# Patient Record
Sex: Male | Born: 1995 | Race: White | Hispanic: No | Marital: Single | Smoking: Never smoker
Health system: Southern US, Community
[De-identification: ages and names within clinical notes are randomized; demographics above are authoritative.]

## PROBLEM LIST (undated history)

## (undated) DIAGNOSIS — F329 Major depressive disorder, single episode, unspecified: Secondary | ICD-10-CM

## (undated) DIAGNOSIS — G43909 Migraine, unspecified, not intractable, without status migrainosus: Secondary | ICD-10-CM

## (undated) DIAGNOSIS — F32A Depression, unspecified: Secondary | ICD-10-CM

## (undated) DIAGNOSIS — T7840XA Allergy, unspecified, initial encounter: Secondary | ICD-10-CM

## (undated) HISTORY — DX: Major depressive disorder, single episode, unspecified: F32.9

## (undated) HISTORY — PX: SIGMOIDOSCOPY: SUR1295

## (undated) HISTORY — DX: Depression, unspecified: F32.A

## (undated) HISTORY — PX: TONSILLECTOMY: SUR1361

## (undated) HISTORY — DX: Allergy, unspecified, initial encounter: T78.40XA

---
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
## 2013-03-09 ENCOUNTER — Ambulatory Visit: Payer: Self-pay | Admitting: Emergency Medicine

## 2014-02-05 ENCOUNTER — Ambulatory Visit: Payer: Self-pay | Admitting: Family Medicine

## 2014-02-16 ENCOUNTER — Ambulatory Visit (INDEPENDENT_AMBULATORY_CARE_PROVIDER_SITE_OTHER): Payer: BC Managed Care – PPO

## 2014-02-16 ENCOUNTER — Ambulatory Visit (INDEPENDENT_AMBULATORY_CARE_PROVIDER_SITE_OTHER): Payer: BC Managed Care – PPO | Admitting: Podiatry

## 2014-02-16 ENCOUNTER — Encounter: Payer: Self-pay | Admitting: Podiatry

## 2014-02-16 VITALS — BP 133/98 | HR 80 | Resp 16 | Ht 73.0 in | Wt 290.0 lb

## 2014-02-16 DIAGNOSIS — Q6650 Congenital pes planus, unspecified foot: Secondary | ICD-10-CM

## 2014-02-16 DIAGNOSIS — M7750 Other enthesopathy of unspecified foot: Secondary | ICD-10-CM

## 2014-02-16 DIAGNOSIS — L03119 Cellulitis of unspecified part of limb: Secondary | ICD-10-CM

## 2014-02-16 DIAGNOSIS — L02619 Cutaneous abscess of unspecified foot: Secondary | ICD-10-CM

## 2014-02-16 NOTE — Progress Notes (Signed)
Subjective:     Patient ID: Victor Schwartz, male   DOB: [birth year], 18 y.o.   MRN: [id number]  HPI patient presents stating I have such flatfeet I been getting blisters and I developed cellulitis which was treated with oral antibiotics and is better. I need something to help lift my arches   Review of Systems  All other systems reviewed and are negative.      Objective:   Physical Exam  Nursing note and vitals reviewed. Cardiovascular: Intact distal pulses.   Musculoskeletal: Normal range of motion.  Neurological: He is alert.  Skin: Skin is warm.   neurovascular status intact with range of motion adequate and mild equinus condition noted of both feet. Muscle strength is within normal limits and I noted there is some abrasion in the medial arch area both feet and also small blisters on the back of the heels both feet with activity. 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 Recommended customized orthotics with scans done today to lift the medial arch and help to prevent pressure that's occurring. Apply Silvadene to the posterior heel where there small blisters and advised on soaks and padding and reappoint when orthotics are ready

## 2014-02-16 NOTE — Progress Notes (Signed)
   Subjective:    Patient ID: Victor Schwartz, male    DOB: 1995-09-18, 18 y.o.   MRN: [id number]  HPI Comments: Was referred here after having cellulitis in both of my feet. i have blisters on the medial side of both feet . Its been about 22 days with them.  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      Objective:   Physical Exam        Assessment & Plan:

## 2014-02-26 ENCOUNTER — Encounter: Payer: Self-pay | Admitting: *Deleted

## 2014-02-26 NOTE — Progress Notes (Signed)
Called and left message for pt to call office to schedule appt to pick up orthotics. Also, sent post card letting him know orthotics are in office.

## 2014-02-26 NOTE — Progress Notes (Signed)
Sent pt post card letting him know orthotics are here. 

## 2014-02-28 ENCOUNTER — Ambulatory Visit: Payer: Self-pay | Admitting: Internal Medicine

## 2014-02-28 LAB — COMPREHENSIVE METABOLIC PANEL
ALT: 76 U/L (ref 12–78)
Albumin: 4.1 g/dL (ref 3.4–5.0)
Alkaline Phosphatase: 68 U/L
Anion Gap: 7 (ref 7–16)
BILIRUBIN TOTAL: 0.5 mg/dL (ref 0.2–1.0)
BUN: 10 mg/dL (ref 7–18)
CREATININE: 0.74 mg/dL (ref 0.60–1.30)
Calcium, Total: 9 mg/dL (ref 8.5–10.1)
Chloride: 104 mmol/L (ref 98–107)
Co2: 28 mmol/L (ref 21–32)
EGFR (African American): >60
Glucose: 90 mg/dL (ref 65–99)
Osmolality: 276 (ref 275–301)
POTASSIUM: 4.3 mmol/L (ref 3.5–5.1)
SGOT(AST): 39 U/L — ABNORMAL HIGH (ref 15–37)
SODIUM: 139 mmol/L (ref 136–145)
TOTAL PROTEIN: 7.2 g/dL (ref 6.4–8.2)

## 2014-02-28 LAB — CBC WITH DIFFERENTIAL/PLATELET
Basophil #: 0 10*3/uL (ref 0.0–0.1)
Basophil %: 0.6 %
EOS PCT: 3.5 %
Eosinophil #: 0.2 10*3/uL (ref 0.0–0.7)
HCT: 44.3 % (ref 40.0–52.0)
HGB: 15.1 g/dL (ref 13.0–18.0)
Lymphocyte #: 2 10*3/uL (ref 1.0–3.6)
Lymphocyte %: 31.7 %
MCH: 30.4 pg (ref 26.0–34.0)
MCHC: 34.2 g/dL (ref 32.0–36.0)
MCV: 89 fL (ref 80–100)
Monocyte #: 0.5 x10 3/mm (ref 0.2–1.0)
Monocyte %: 8 %
Neutrophil #: 3.6 10*3/uL (ref 1.4–6.5)
Neutrophil %: 56.2 %
PLATELETS: 249 10*3/uL (ref 150–440)
RBC: 4.97 10*6/uL (ref 4.40–5.90)
RDW: 12.7 % (ref 11.5–14.5)
WBC: 6.3 10*3/uL (ref 3.8–10.6)

## 2014-02-28 LAB — URINALYSIS, COMPLETE
Bilirubin,UR: NEGATIVE
Glucose,UR: NEGATIVE mg/dL (ref 0–75)
Ketone: NEGATIVE
Leukocyte Esterase: NEGATIVE
NITRITE: NEGATIVE
Ph: 6.5 (ref 4.5–8.0)
SPECIFIC GRAVITY: 1.02 (ref 1.003–1.030)
Squamous Epithelial: NONE SEEN

## 2014-03-05 ENCOUNTER — Ambulatory Visit (INDEPENDENT_AMBULATORY_CARE_PROVIDER_SITE_OTHER): Payer: BC Managed Care – PPO | Admitting: *Deleted

## 2014-03-05 DIAGNOSIS — M7750 Other enthesopathy of unspecified foot: Secondary | ICD-10-CM

## 2014-03-05 DIAGNOSIS — Q6650 Congenital pes planus, unspecified foot: Secondary | ICD-10-CM

## 2014-03-05 NOTE — Progress Notes (Signed)
Pt  Presents for orthotic pick up written and verbal instructions given

## 2014-03-05 NOTE — Patient Instructions (Signed)

## 2014-04-02 ENCOUNTER — Other Ambulatory Visit: Payer: BC Managed Care – PPO

## 2016-01-17 ENCOUNTER — Ambulatory Visit
Admission: EM | Admit: 2016-01-17 | Discharge: 2016-01-17 | Disposition: A | Discharge status: Home / Self Care | Payer: Managed Care, Other (non HMO) | Attending: Family Medicine | Admitting: Family Medicine

## 2016-01-17 DIAGNOSIS — J029 Acute pharyngitis, unspecified: Secondary | ICD-10-CM | POA: Diagnosis not present

## 2016-01-17 DIAGNOSIS — J0100 Acute maxillary sinusitis, unspecified: Secondary | ICD-10-CM

## 2016-01-17 HISTORY — DX: Migraine, unspecified, not intractable, without status migrainosus: G43.909

## 2016-01-17 LAB — CBC WITH DIFFERENTIAL/PLATELET
Basophils Absolute: 0.1 10*3/uL (ref 0–0.1)
Basophils Relative: 1 %
Eosinophils Absolute: 0.2 10*3/uL (ref 0–0.7)
HCT: 44.9 % (ref 40.0–52.0)
HEMOGLOBIN: 15.4 g/dL (ref 13.0–18.0)
Lymphs Abs: 4.1 10*3/uL — ABNORMAL HIGH (ref 1.0–3.6)
MCH: 29.5 pg (ref 26.0–34.0)
MCHC: 34.4 g/dL (ref 32.0–36.0)
MCV: 85.8 fL (ref 80.0–100.0)
Monocytes Absolute: 1.3 10*3/uL — ABNORMAL HIGH (ref 0.2–1.0)
Monocytes Relative: 9 %
Neutro Abs: 8.9 10*3/uL — ABNORMAL HIGH (ref 1.4–6.5)
PLATELETS: 348 10*3/uL (ref 150–440)
RBC: 5.23 MIL/uL (ref 4.40–5.90)
RDW: 12.8 % (ref 11.5–14.5)
WBC: 14.6 10*3/uL — AB (ref 3.8–10.6)

## 2016-01-17 LAB — RAPID STREP SCREEN (MED CTR MEBANE ONLY): STREPTOCOCCUS, GROUP A SCREEN (DIRECT): NEGATIVE

## 2016-01-17 LAB — MONONUCLEOSIS SCREEN: MONO SCREEN: NEGATIVE

## 2016-01-17 MED ORDER — PREDNISONE 10 MG (21) PO TBPK: ORAL_TABLET | ORAL | Status: AC

## 2016-01-17 MED ORDER — FEXOFENADINE-PSEUDOEPHED ER 180-240 MG PO TB24: 1.0000 | ORAL_TABLET | Freq: Every day | ORAL | Status: AC

## 2016-01-17 MED ORDER — LEVOFLOXACIN 500 MG PO TABS: 500.0000 mg | ORAL_TABLET | Freq: Every day | ORAL | Status: AC

## 2016-01-17 MED ORDER — FLUTICASONE PROPIONATE 50 MCG/ACT NA SUSP: 2.0000 | Freq: Every day | NASAL | Status: AC

## 2016-01-17 NOTE — Discharge Instructions (Signed)
Pharyngitis °Pharyngitis is a sore throat (pharynx). There is redness, pain, and swelling of your throat. °HOME CARE  °· Drink enough fluids to keep your pee (urine) clear or pale yellow. °· Only take medicine as told by your doctor. °· You may get sick again if you do not take medicine as told. Finish your medicines, even if you start to feel better. °· Do not take aspirin. °· Rest. °· Rinse your mouth (gargle) with salt water (½ tsp of salt per 1 qt of water) every 1-2 hours. This will help the pain. °· If you are not at risk for choking, you can suck on hard candy or sore throat lozenges. °GET HELP IF: °· You have large, tender lumps on your neck. °· You have a rash. °· You cough up green, yellow-brown, or bloody spit. °GET HELP RIGHT AWAY IF:  °· You have a stiff neck. °· You drool or cannot swallow liquids. °· You throw up (vomit) or are not able to keep medicine or liquids down. °· You have very bad pain that does not go away with medicine. °· You have problems breathing (not from a stuffy nose). °MAKE SURE YOU:  °· Understand these instructions. °· Will watch your condition. °· Will get help right away if you are not doing well or get worse. °  °This information is not intended to replace advice given to you by your health care provider. Make sure you discuss any questions you have with your health care provider. °  °Document Released: 02/13/2008 Document Revised: 06/17/2013 Document Reviewed: 05/04/2013 °Elsevier Interactive Patient Education ©2016 [employer] ° °Sinusitis, Adult °Sinusitis is redness, soreness, and puffiness (inflammation) of the air pockets in the bones of your face (sinuses). The redness, soreness, and puffiness can cause air and mucus to get trapped in your sinuses. This can allow germs to grow and cause an infection.  °HOME CARE  °· Drink enough fluids to keep your pee (urine) clear or pale yellow. °· Use a humidifier in your home. °· Run a hot shower to create steam in the bathroom.  Sit in the bathroom with the door closed. Breathe in the steam 3-4 times a day. °· Put a warm, moist washcloth on your face 3-4 times a day, or as told by your doctor. °· Use salt water sprays (saline sprays) to wet the thick fluid in your nose. This can help the sinuses drain. °· Only take medicine as told by your doctor. °GET HELP RIGHT AWAY IF:  °· Your pain gets worse. °· You have very bad headaches. °· You are sick to your stomach (nauseous). °· You throw up (vomit). °· You are very sleepy (drowsy) all the time. °· Your face is puffy (swollen). °· Your vision changes. °· You have a stiff neck. °· You have trouble breathing. °MAKE SURE YOU:  °· Understand these instructions. °· Will watch your condition. °· Will get help right away if you are not doing well or get worse. °  °This information is not intended to replace advice given to you by your health care provider. Make sure you discuss any questions you have with your health care provider. °  °Document Released: 02/13/2008 Document Revised: 09/17/2014 Document Reviewed: 04/01/2012 °Elsevier Interactive Patient Education ©2016 [employer] ° °

## 2016-01-17 NOTE — ED Provider Notes (Signed)
CSN: [id number]     Arrival date & time 01/17/16  1024 History   First MD Initiated Contact with Patient 01/17/16 1110    Nurses notes were reviewed. Chief Complaint  Patient presents with  . Sore Throat    Several weeks of sore throat, coughing up green phlegm and green nasal drainage. Treated for both strep throat and sinus infection with ABX but reports not feeling better. Pain 4/10   Patient reports April 17 going see his doctor for positive strep throat. At about a week of being on amoxicillin didn't like using much better he was seen and he was placed on cefuroxime 500 mg twice a day for 10 days and prednisone and Cheratussin. He states that about 3 or 4 days he actually did feel better but he started feeling worse and he still has 3 days left of his cefuroxime came in to be seen and evaluated. There is some fatigue. He never smoked he's had tonsillectomy and sigmoidoscopy in the past history of allergies depression and migraines. The only pertinent family medical history is assisted he has not seen in about 3 months has come down with mono.     (Consider location/radiation/quality/duration/timing/severity/associated sxs/prior Treatment) Patient is a 20 y.o. male presenting with pharyngitis. The history is provided by the patient. No language interpreter was used.  Sore Throat This is a new problem. The current episode started more than 1 week ago. The problem occurs constantly. The problem has been gradually worsening. Pertinent negatives include no chest pain, no abdominal pain, no headaches and no shortness of breath. Nothing aggravates the symptoms. Nothing relieves the symptoms. He has tried nothing for the symptoms. The treatment provided no relief.    Past Medical History  Diagnosis Date  . Allergy   . Depression   . Migraine    Past Surgical History  Procedure Laterality Date  . Tonsillectomy    . Sigmoidoscopy     History reviewed. No pertinent family history. Social  History  Substance Use Topics  . Smoking status: Never Smoker   . Smokeless tobacco: Never Used  . Alcohol Use: Yes     Comment: social    Review of Systems  Constitutional: Negative for fever.  HENT: Positive for congestion. Negative for ear pain, facial swelling and hearing loss.   Eyes: Negative for pain and redness.  Respiratory: Negative for shortness of breath.   Cardiovascular: Negative for chest pain.  Gastrointestinal: Negative for abdominal pain.  Musculoskeletal: Positive for myalgias. Negative for neck pain.  Neurological: Negative for headaches.  All other systems reviewed and are negative.   Allergies  Minocycline  Home Medications   Prior to Admission medications   Medication Sig Start Date End Date Taking? Authorizing Provider  cefUROXime (CEFTIN) 500 MG tablet Take 500 mg by mouth 2 (two) times daily with a meal.   Yes Historical Provider, MD  SUMAtriptan (IMITREX) 25 MG tablet Take 25 mg by mouth every 2 (two) hours as needed for migraine. May repeat in 2 hours if headache persists or recurs.   Yes Historical Provider, MD  cetirizine (ZYRTEC) 10 MG tablet Take by mouth. 01/16/08   Historical Provider, MD  fexofenadine-pseudoephedrine (ALLEGRA-D ALLERGY & CONGESTION) 180-240 MG 24 hr tablet Take 1 tablet by mouth daily. 01/17/16   Hassan RowanEugene Keland Peyton, MD  fluticasone (FLONASE) 50 MCG/ACT nasal spray Place 2 sprays into both nostrils daily. 01/17/16   Hassan RowanEugene Lasean Rahming, MD  levofloxacin (LEVAQUIN) 500 MG tablet Take 1 tablet (500 mg total) by  mouth daily. 01/17/16   Hassan Rowan, MD  predniSONE (STERAPRED UNI-PAK 21 TAB) 10 MG (21) TBPK tablet Sig 6 tablet day 1, 5 tablets day 2, 4 tablets day 3,,3tablets day 4, 2 tablets day 5, 1 tablet day 6 take all tablets orally 01/17/16   Hassan Rowan, MD   Meds Ordered and Administered this Visit  Medications - No data to display  BP 144/89 mmHg  Pulse 93  Temp(Src) 98 F (36.7 C) (Oral)  Resp 20  Ht 6\' 1"  (1.854 m)  Wt 300 lb (136.079 kg)   BMI 39.59 kg/m2  SpO2 98% No data found.   Physical Exam  Constitutional: He is oriented to person, place, and time. He appears well-developed and well-nourished.  HENT:  Head: Normocephalic and atraumatic.  Right Ear: Hearing, tympanic membrane, external ear and ear canal normal.  Left Ear: Hearing, tympanic membrane, external ear and ear canal normal.  Nose: Mucosal edema and rhinorrhea present. Right sinus exhibits maxillary sinus tenderness. Left sinus exhibits maxillary sinus tenderness.  Mouth/Throat: Uvula is midline. No dental caries. Posterior oropharyngeal erythema present.  Eyes: Conjunctivae are normal. Pupils are equal, round, and reactive to light.  Neck: Normal range of motion. Neck supple.  Cardiovascular: Normal rate and regular rhythm.   Pulmonary/Chest: Effort normal and breath sounds normal.  Musculoskeletal: Normal range of motion.  Lymphadenopathy:    He has cervical adenopathy.  Neurological: He is alert and oriented to person, place, and time.  Skin: Skin is warm.  Psychiatric: He has a normal mood and affect.  Vitals reviewed.   ED Course  Procedures (including critical care time)  Labs Review Labs Reviewed  CBC WITH DIFFERENTIAL/PLATELET - Abnormal; Notable for the following:    WBC 14.6 (*)    Neutro Abs 8.9 (*)    Lymphs Abs 4.1 (*)    Monocytes Absolute 1.3 (*)    All other components within normal limits  RAPID STREP SCREEN (NOT AT [facility])  CULTURE, GROUP A STREP [facility])  MONONUCLEOSIS SCREEN    Imaging Review No results found.   Visual Acuity Review  Right Eye Distance:   Left Eye Distance:   Bilateral Distance:    Right Eye Near:   Left Eye Near:    Bilateral Near:      Results for orders placed or performed during the hospital encounter of 01/17/16  Rapid strep screen  Result Value Ref Range   Streptococcus, Group A Screen (Direct) NEGATIVE NEGATIVE  CBC with Differential  Result Value Ref Range   WBC 14.6 (H) 3.8 - 10.6  K/uL   RBC 5.23 4.40 - 5.90 MIL/uL   Hemoglobin 15.4 13.0 - 18.0 g/dL   HCT 16.1 09.6 - 04.5 %   MCV 85.8 80.0 - 100.0 fL   MCH 29.5 26.0 - 34.0 pg   MCHC 34.4 32.0 - 36.0 g/dL   RDW 40.9 81.1 - 91.4 %   Platelets 348 150 - 440 K/uL   Neutrophils Relative % 60% %   Neutro Abs 8.9 (H) 1.4 - 6.5 K/uL   Lymphocytes Relative 28% %   Lymphs Abs 4.1 (H) 1.0 - 3.6 K/uL   Monocytes Relative 9% %   Monocytes Absolute 1.3 (H) 0.2 - 1.0 K/uL   Eosinophils Relative 2% %   Eosinophils Absolute 0.2 0 - 0.7 K/uL   Basophils Relative 1% %   Basophils Absolute 0.1 0 - 0.1 K/uL  Mononucleosis screen  Result Value Ref Range   Mono Screen NEGATIVE NEGATIVE  MDM   1. Acute maxillary sinusitis, recurrence not specified   2. Pharyngitis      Patient has been resistant to treatment first with amoxicillin and now with cefuroxime. This could be just a resistant sinus infection and may place him on Levaquin another 6 a course of prednisone Allegra-D and Flonase nasal spray and see if that would only do the trick before we do that and check a mono and CBC on him as well.   Strep test and mono test were negative white count is elevated 14,000. Stop the Ceftin and will place him on another round of prednisone Flonase nasal spray Allegra-D and Levaquin 500 one tablet day for 10 days follow-up PCP if not better work note given for today as well.     Note: This dictation was prepared with Dragon dictation along with smaller phrase technology. Any transcriptional errors that result from this process are unintentional.\  ,  Hassan Rowan, MD 01/17/16 1231

## 2016-01-19 LAB — CULTURE, GROUP A STREP (THRC)
